# Patient Record
Sex: Male | Born: 1980 | Race: Black or African American | Hispanic: No | Marital: Single | Smoking: Light tobacco smoker
Health system: Southern US, Community
[De-identification: ages and names within clinical notes are randomized; demographics above are authoritative.]

## PROBLEM LIST (undated history)

## (undated) DIAGNOSIS — Z789 Other specified health status: Secondary | ICD-10-CM

---

## 2004-10-01 ENCOUNTER — Emergency Department: Payer: Self-pay | Admitting: Emergency Medicine

## 2005-01-04 ENCOUNTER — Emergency Department: Payer: Self-pay | Admitting: Emergency Medicine

## 2008-01-05 ENCOUNTER — Emergency Department: Payer: Self-pay | Admitting: Emergency Medicine

## 2008-11-09 ENCOUNTER — Emergency Department: Payer: Self-pay | Admitting: Emergency Medicine

## 2009-03-04 ENCOUNTER — Emergency Department: Payer: Self-pay | Admitting: Emergency Medicine

## 2011-09-13 ENCOUNTER — Emergency Department: Payer: Self-pay | Admitting: Emergency Medicine

## 2012-01-16 ENCOUNTER — Emergency Department: Payer: Self-pay | Admitting: Emergency Medicine

## 2013-10-22 ENCOUNTER — Emergency Department: Payer: Self-pay | Admitting: Emergency Medicine
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
## 2015-06-02 IMAGING — CR DG SHOULDER 3+V*L*
1 series · 3 of 3 positions shown · non-contrast
Comparison: None.

CLINICAL DATA: Left shoulder pain, numbness

EXAM:
DG SHOULDER 3+VIEWS LEFT

[Series 1: w shoulder grashey left · 0.14mm/px · 3 of 3 slices shown]
[im 1/3]
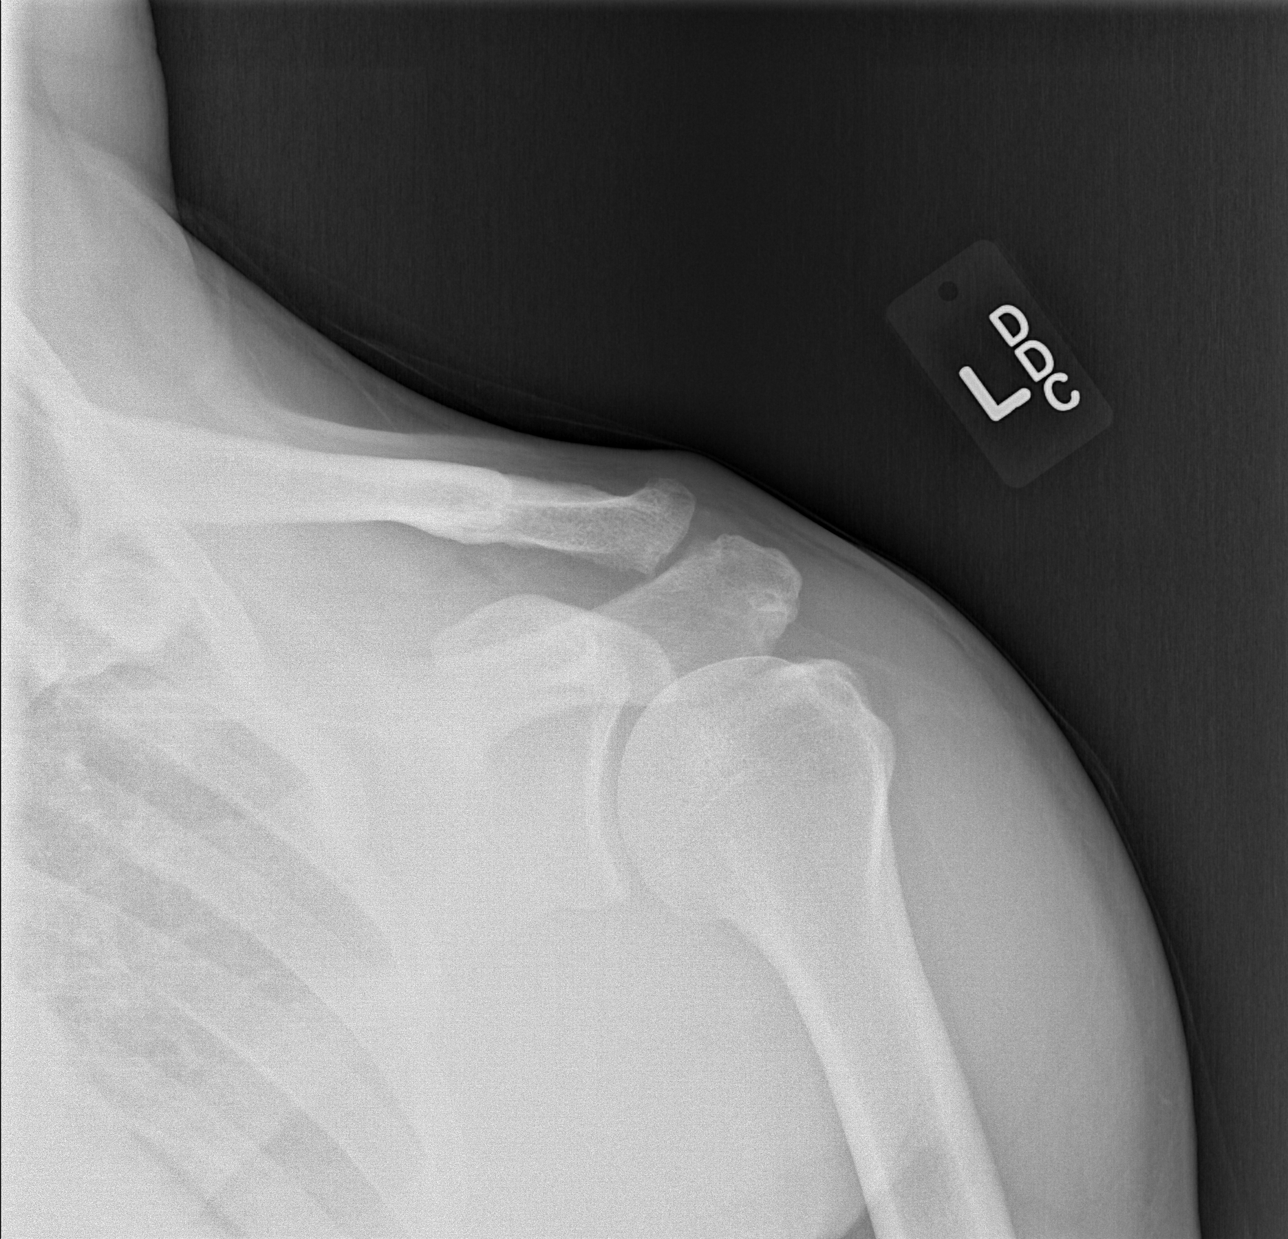
[im 2/3]
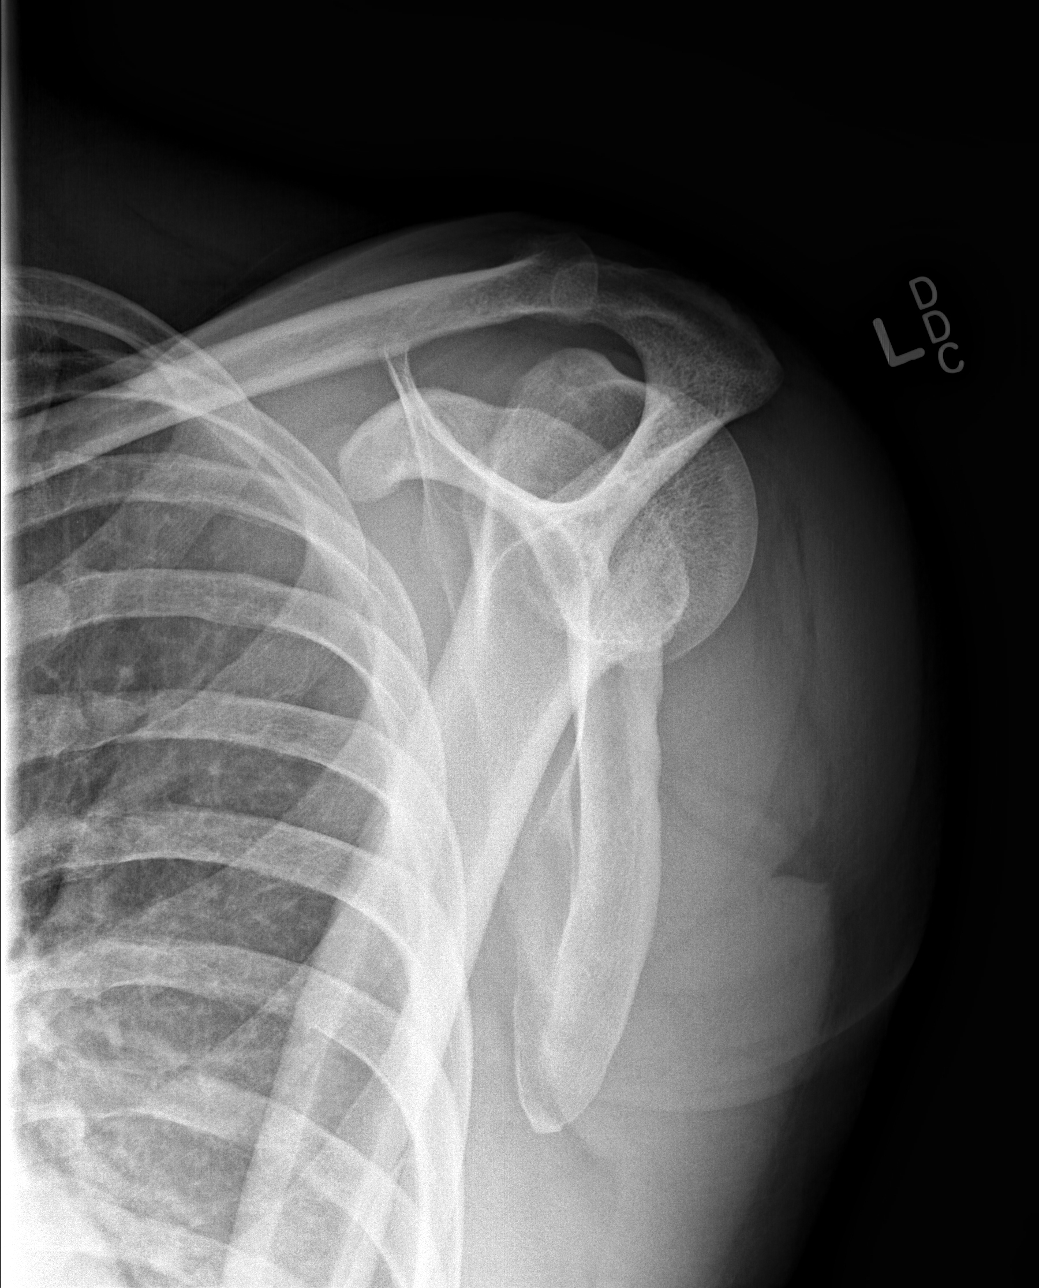
[im 3/3]
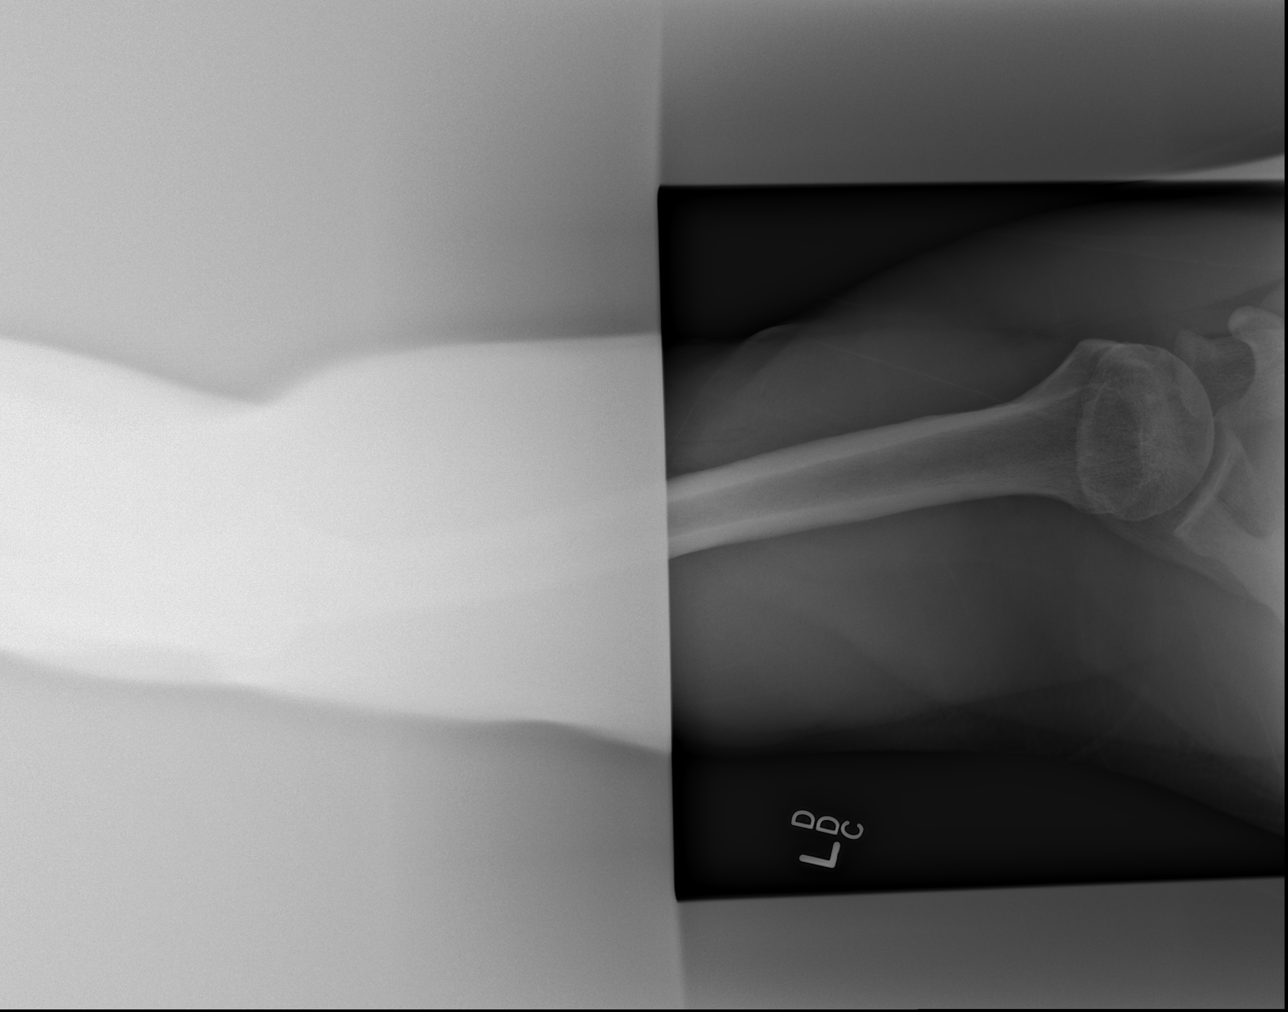

[3 of 3 positions shown; findings below may reference images not displayed]

FINDINGS: Three views of left shoulder submitted. No acute fracture or
subluxation. No radiopaque foreign body.
IMPRESSION: Negative.

## 2016-01-05 DIAGNOSIS — S0181XA Laceration without foreign body of other part of head, initial encounter: Secondary | ICD-10-CM | POA: Insufficient documentation

## 2016-01-05 DIAGNOSIS — Y929 Unspecified place or not applicable: Secondary | ICD-10-CM | POA: Insufficient documentation

## 2016-01-05 DIAGNOSIS — W268XXA Contact with other sharp object(s), not elsewhere classified, initial encounter: Secondary | ICD-10-CM | POA: Insufficient documentation

## 2016-01-05 DIAGNOSIS — Y939 Activity, unspecified: Secondary | ICD-10-CM | POA: Insufficient documentation

## 2016-01-05 DIAGNOSIS — Y999 Unspecified external cause status: Secondary | ICD-10-CM | POA: Insufficient documentation
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
## 2016-01-06 ENCOUNTER — Emergency Department
Admission: EM | Admit: 2016-01-06 | Discharge: 2016-01-06 | Disposition: A | Discharge status: Home / Self Care | Payer: Self-pay | Attending: Emergency Medicine | Admitting: Emergency Medicine

## 2016-01-06 DIAGNOSIS — S0181XA Laceration without foreign body of other part of head, initial encounter: Secondary | ICD-10-CM
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
## 2016-01-06 NOTE — ED Notes (Signed)
Discharge instructions reviewed with patient. Patient verbalized understanding. Patient ambulated to lobby without difficulty.   

## 2016-01-06 NOTE — Discharge Instructions (Signed)
Facial Laceration ° A facial laceration is a cut on the face. These injuries can be painful and cause bleeding. Lacerations usually heal quickly, but they need special care to reduce scarring. °DIAGNOSIS  °Your health care provider will take a medical history, ask for details about how the injury occurred, and examine the wound to determine how deep the cut is. °TREATMENT  °Some facial lacerations may not require closure. Others may not be able to be closed because of an increased risk of infection. The risk of infection and the chance for successful closure will depend on various factors, including the amount of time since the injury occurred. °The wound may be cleaned to help prevent infection. If closure is appropriate, pain medicines may be given if needed. Your health care provider will use stitches (sutures), wound glue (adhesive), or skin adhesive strips to repair the laceration. These tools bring the skin edges together to allow for faster healing and a better cosmetic outcome. If needed, you may also be given a tetanus shot. °HOME CARE INSTRUCTIONS °· Only take over-the-counter or prescription medicines as directed by your health care provider. °· Follow your health care provider's instructions for wound care. These instructions will vary depending on the technique used for closing the wound. °For Sutures: °· Keep the wound clean and dry.   °· If you were given a bandage (dressing), you should change it at least once a day. Also change the dressing if it becomes wet or dirty, or as directed by your health care provider.   °· Wash the wound with soap and water 2 times a day. Rinse the wound off with water to remove all soap. Pat the wound dry with a clean towel.   °· After cleaning, apply a thin layer of the antibiotic ointment recommended by your health care provider. This will help prevent infection and keep the dressing from sticking.   °· You may shower as usual after the first 24 hours. Do not soak the  wound in water until the sutures are removed.   °· Get your sutures removed as directed by your health care provider. With facial lacerations, sutures should usually be taken out after 4-5 days to avoid stitch marks.   °· Wait a few days after your sutures are removed before applying any makeup. °For Skin Adhesive Strips: °· Keep the wound clean and dry.   °· Do not get the skin adhesive strips wet. You may bathe carefully, using caution to keep the wound dry.   °· If the wound gets wet, pat it dry with a clean towel.   °· Skin adhesive strips will fall off on their own. You may trim the strips as the wound heals. Do not remove skin adhesive strips that are still stuck to the wound. They will fall off in time.   °For Wound Adhesive: °· You may briefly wet your wound in the shower or bath. Do not soak or scrub the wound. Do not swim. Avoid periods of heavy sweating until the skin adhesive has fallen off on its own. After showering or bathing, gently pat the wound dry with a clean towel.   °· Do not apply liquid medicine, cream medicine, ointment medicine, or makeup to your wound while the skin adhesive is in place. This may loosen the film before your wound is healed.   °· If a dressing is placed over the wound, be careful not to apply tape directly over the skin adhesive. This may cause the adhesive to be pulled off before the wound is healed.   °· Avoid   prolonged exposure to sunlight or tanning lamps while the skin adhesive is in place. °· The skin adhesive will usually remain in place for 5-10 days, then naturally fall off the skin. Do not pick at the adhesive film.   °After Healing: °Once the wound has healed, cover the wound with sunscreen during the day for 1 full year. This can help minimize scarring. Exposure to ultraviolet light in the first year will darken the scar. It can take 1-2 years for the scar to lose its redness and to heal completely.  °SEEK MEDICAL CARE IF: °· You have a fever. °SEEK IMMEDIATE  MEDICAL CARE IF: °· You have redness, pain, or swelling around the wound.   °· You see a yellowish-white fluid (pus) coming from the wound.   °  °This information is not intended to replace advice given to you by your health care provider. Make sure you discuss any questions you have with your health care provider. °  °Document Released: 08/05/2004 Document Revised: 07/19/2014 Document Reviewed: 02/08/2013 °Elsevier Interactive Patient Education ©2016 [employer] ° °Stitches, Staples, or Adhesive Wound Closure °Health care providers use stitches (sutures), staples, and certain glue (skin adhesives) to hold skin together while it heals (wound closure). You may need this treatment after you have surgery or if you cut your skin accidentally. These methods help your skin to heal more quickly and make it less likely that you will have a scar. A wound may take several months to heal completely. °The type of wound you have determines when your wound gets closed. In most cases, the wound is closed as soon as possible (primary skin closure). Sometimes, closure is delayed so the wound can be cleaned and allowed to heal naturally. This reduces the chance of infection. Delayed closure may be needed if your wound: °· Is caused by a bite. °· Happened more than 6 hours ago. °· Involves loss of skin or the tissues under the skin. °· Has dirt or debris in it that cannot be removed. °· Is infected. °WHAT ARE THE DIFFERENT KINDS OF WOUND CLOSURES? °There are many options for wound closure. The one that your health care provider uses depends on how deep and how large your wound is. °Adhesive Glue °To use this type of glue to close a wound, your health care provider holds the edges of the wound together and paints the glue on the surface of your skin. You may need more than one layer of glue. Then the wound may be covered with a light bandage (dressing). °This type of skin closure may be used for small wounds that are not deep  (superficial). Using glue for wound closure is less painful than other methods. It does not require a medicine that numbs the area (local anesthetic). This method also leaves nothing to be removed. Adhesive glue is often used for children and on facial wounds. °Adhesive glue cannot be used for wounds that are deep, uneven, or bleeding. It is not used inside of a wound.  °Adhesive Strips °These strips are made of sticky (adhesive), porous paper. They are applied across your skin edges like a regular adhesive bandage. You leave them on until they fall off. °Adhesive strips may be used to close very superficial wounds. They may also be used along with sutures to improve the closure of your skin edges.  °Sutures °Sutures are the oldest method of wound closure. Sutures can be made from natural substances, such as silk, or from synthetic materials, such as nylon and steel. They can   be made from a material that your body can break down as your wound heals (absorbable), or they can be made from a material that needs to be removed from your skin (nonabsorbable). They come in many different strengths and sizes. °Your health care provider attaches the sutures to a steel needle on one end. Sutures can be passed through your skin, or through the tissues beneath your skin. Then they are tied and cut. Your skin edges may be closed in one continuous stitch or in separate stitches. °Sutures are strong and can be used for all kinds of wounds. Absorbable sutures may be used to close tissues under the skin. The disadvantage of sutures is that they may cause skin reactions that lead to infection. Nonabsorbable sutures need to be removed. °Staples °When surgical staples are used to close a wound, the edges of your skin on both sides of the wound are brought close together. A staple is placed across the wound, and an instrument secures the edges together. Staples are often used to close surgical cuts (incisions). °Staples are faster to  use than sutures, and they cause less skin reaction. Staples need to be removed using a tool that bends the staples away from your skin. °HOW DO I CARE FOR MY WOUND CLOSURE? °· Take medicines only as directed by your health care provider. °· If you were prescribed an antibiotic medicine for your wound, finish it all even if you start to feel better. °· Use ointments or creams only as directed by your health care provider. °· Wash your hands with soap and water before and after touching your wound. °· Do not soak your wound in water. Do not take baths, swim, or use a hot tub until your health care provider approves. °· Ask your health care provider when you can start showering. Cover your wound if directed by your health care provider. °· Do not take out your own sutures or staples. °· Do not pick at your wound. Picking can cause an infection. °· Keep all follow-up visits as directed by your health care provider. This is important. °HOW LONG WILL I HAVE MY WOUND CLOSURE? °· Leave adhesive glue on your skin until the glue peels away. °· Leave adhesive strips on your skin until the strips fall off. °· Absorbable sutures will dissolve within several days. °· Nonabsorbable sutures and staples must be removed. The location of the wound will determine how long they stay in. This can range from several days to a couple of weeks. °WHEN SHOULD I SEEK HELP FOR MY WOUND CLOSURE? °Contact your health care provider if: °· You have a fever. °· You have chills. °· You have drainage, redness, swelling, or pain at your wound. °· There is a bad smell coming from your wound. °· The skin edges of your wound start to separate after your sutures have been removed. °· Your wound becomes thick, raised, and darker in color after your sutures come out (scarring). °  °This information is not intended to replace advice given to you by your health care provider. Make sure you discuss any questions you have with your health care provider. °    °Document Released: 03/23/2001 Document Revised: 07/19/2014 Document Reviewed: 12/05/2013 °Elsevier Interactive Patient Education ©2016 Elsevier Inc. ° °

## 2019-10-16 ENCOUNTER — Ambulatory Visit: Payer: Self-pay

## 2019-10-16 ENCOUNTER — Ambulatory Visit: Payer: Commercial Managed Care - PPO | Admitting: Family Medicine

## 2019-10-16 ENCOUNTER — Other Ambulatory Visit: Payer: Self-pay

## 2019-10-16 DIAGNOSIS — Z113 Encounter for screening for infections with a predominantly sexual mode of transmission: Secondary | ICD-10-CM

## 2019-10-16 LAB — GRAM STAIN

## 2019-10-16 NOTE — Progress Notes (Signed)
Here today for STD screening. Accepts bloodwork. Zissy Hamlett, RN ° °

## 2019-10-16 NOTE — Progress Notes (Signed)
Gram Stain results reviewed by provider Maximiano Coss, PA-C. Per same no treatment indicated. Tawny Hopping, RN

## 2019-10-16 NOTE — Progress Notes (Signed)
  [facility] Department STI clinic/screening visit  Subjective:  Gavin Diaz is a 39 y.o. male being seen today for  Chief Complaint  Patient presents with  . SEXUALLY TRANSMITTED DISEASE     The patient reports they do have symptoms.   Patient has the following medical conditions:  There are no problems to display for this patient.   HPI  Pt reports he would like STI screening. He has seen clear discharge from penis once or twice over past x1-2 months. Urinated 3 hrs ago. No other symptoms.  See flowsheet for further details and programmatic requirements.    No components found for: HCV  The following portions of the patient's history were reviewed and updated as appropriate: allergies, current medications, past medical history, past social history, past surgical history and problem list.  Objective:  There were no vitals filed for this visit.   Physical Exam Constitutional:      Appearance: Normal appearance.  HENT:     Head: Normocephalic and atraumatic.     Comments: No nits or hair loss    Mouth/Throat:     Mouth: Mucous membranes are moist.     Pharynx: Oropharynx is clear. No oropharyngeal exudate or posterior oropharyngeal erythema.  Pulmonary:     Effort: Pulmonary effort is normal.  Abdominal:     General: Abdomen is flat.     Palpations: Abdomen is soft. There is no hepatomegaly or mass.     Tenderness: There is no abdominal tenderness.  Genitourinary:    Pubic Area: No rash or pubic lice.      Penis: Normal and circumcised.      Testes: Normal.     Epididymis:     Right: Normal.     Left: Normal.     Rectum: Normal.  Lymphadenopathy:     Head:     Right side of head: No preauricular or posterior auricular adenopathy.     Left side of head: No preauricular or posterior auricular adenopathy.     Cervical: No cervical adenopathy.     Upper Body:     Right upper body: No supraclavicular or axillary adenopathy.     Left upper body: No  supraclavicular or axillary adenopathy.     Lower Body: No right inguinal adenopathy. No left inguinal adenopathy.  Skin:    General: Skin is warm and dry.     Findings: No rash.  Neurological:     Mental Status: He is alert and oriented to person, place, and time.       Assessment and Plan:  Gavin Diaz is a 39 y.o. male presenting to the [facility] Department for STI screening   1. Screening examination for venereal disease -Screenings today as below. Treat gram stain per standing order. -Patient does meet criteria for HepB, HepC Screening. Accepts these screenings. -Advised to RTC for further evaluation if symptoms persist or worsen. Counseled on warning s/sx and when to seek care. Recommended condom use with all sex and discussed importance of condom use for STI prevention. - Gram stain - HBV Antigen/Antibody [facility] - HIV/HCV [place] [facility] - Syphilis Serology, [place] [facility]     Return if symptoms worsen or fail to improve.  Future Appointments  Date Time Provider Department Center  10/16/2019  2:20 PM Carah Barrientes, Hulda Humphrey, PA-C [facility] None    Ann Held, PA-C

## 2019-10-19 LAB — HEPATITIS B SURFACE ANTIGEN

## 2019-10-20 LAB — GONOCOCCUS CULTURE

## 2019-10-23 LAB — HM HIV SCREENING LAB: HM HIV Screening: NEGATIVE

## 2019-10-23 LAB — HM HEPATITIS C SCREENING LAB: HM Hepatitis Screen: NEGATIVE

## 2021-12-07 ENCOUNTER — Other Ambulatory Visit: Payer: Self-pay

## 2021-12-07 ENCOUNTER — Emergency Department
Admission: EM | Admit: 2021-12-07 | Discharge: 2021-12-07 | Disposition: A | Discharge status: Home / Self Care | Payer: Commercial Managed Care - PPO | Attending: Emergency Medicine | Admitting: Emergency Medicine

## 2021-12-07 ENCOUNTER — Encounter: Payer: Self-pay | Admitting: Emergency Medicine

## 2021-12-07 DIAGNOSIS — W57XXXA Bitten or stung by nonvenomous insect and other nonvenomous arthropods, initial encounter: Secondary | ICD-10-CM | POA: Insufficient documentation

## 2021-12-07 DIAGNOSIS — S60861A Insect bite (nonvenomous) of right wrist, initial encounter: Secondary | ICD-10-CM | POA: Insufficient documentation

## 2021-12-07 MED ORDER — CEPHALEXIN 500 MG PO CAPS: 500.0000 mg | ORAL_CAPSULE | Freq: Two times a day (BID) | ORAL | 0 refills | Status: DC

## 2021-12-07 NOTE — ED Triage Notes (Signed)
Pt reports thinks he got bit by a spider and today he woke up and had swelling and pain in his right wrist.

## 2021-12-07 NOTE — ED Provider Notes (Signed)
   [facility] Provider Note    Event Date/Time   First MD Initiated Contact with Patient 12/07/21 1612     (approximate)   History   Insect Bite   HPI  Gavin Diaz is a 41 y.o. male who presents with concern for insect bite to his right wrist, he noticed it this morning, reports it was mildly swollen, denies pain.  Reports clearish discharge.  No fever or redness     Physical Exam   Triage Vital Signs: ED Triage Vitals  Enc Vitals Group     BP 12/07/21 1448 (!) 142/84     Pulse Rate 12/07/21 1448 72     Resp 12/07/21 1448 16     Temp 12/07/21 1448 98 F (36.7 C)     Temp Source 12/07/21 1448 Oral     SpO2 12/07/21 1448 97 %     Weight 12/07/21 1442 70.3 kg (155 lb)     Height 12/07/21 1442 1.727 m (5\' 8" )     Head Circumference --      Peak Flow --      Pain Score 12/07/21 1442 1     Pain Loc --      Pain Edu? --      Excl. in GC? --     Most recent vital signs: Vitals:   12/07/21 1448  BP: (!) 142/84  Pulse: 72  Resp: 16  Temp: 98 F (36.7 C)  SpO2: 97%     General: Awake, no distress.  CV:  Good peripheral perfusion.  Resp:  Normal effort.  Abd:  No distention.  Other:  Small area of inflammation right dorsal lateral wrist, no purulent discharge, small mount of clearish discharge, nontender, no erythema   ED Results / Procedures / Treatments   Labs (all labs ordered are listed, but only abnormal results are displayed) Labs Reviewed - No data to display   EKG     RADIOLOGY     PROCEDURES:  Critical Care performed:   Procedures   MEDICATIONS ORDERED IN ED: Medications - No data to display   IMPRESSION / MDM / ASSESSMENT AND PLAN / ED COURSE  I reviewed the triage vital signs and the nursing notes. Patient's presentation is most consistent with acute, uncomplicated illness.  Differential includes insect bite, cellulitis, abscess  Patient's exam is most consistent with insect bite, no swelling or  erythema to suggest infection  Recommend conservative care, prescription provided in case worsening symptoms        FINAL CLINICAL IMPRESSION(S) / ED DIAGNOSES   Final diagnoses:  Insect bite of right forearm, initial encounter     Rx / DC Orders   ED Discharge Orders          Ordered    cephALEXin (KEFLEX) 500 MG capsule  2 times daily        12/07/21 1618             Note:  This document was prepared using Dragon voice recognition software and may include unintentional dictation errors.   12/09/21, MD 12/07/21 [phone]

## 2021-12-07 NOTE — ED Triage Notes (Signed)
Pt called from [facility] to treatment room, no response 

## 2022-06-11 DIAGNOSIS — X58XXXA Exposure to other specified factors, initial encounter: Secondary | ICD-10-CM | POA: Insufficient documentation

## 2022-06-11 DIAGNOSIS — S6992XA Unspecified injury of left wrist, hand and finger(s), initial encounter: Secondary | ICD-10-CM | POA: Diagnosis present

## 2022-06-11 DIAGNOSIS — S66912A Strain of unspecified muscle, fascia and tendon at wrist and hand level, left hand, initial encounter: Secondary | ICD-10-CM | POA: Diagnosis not present

## 2022-06-12 ENCOUNTER — Emergency Department
Admission: EM | Admit: 2022-06-12 | Discharge: 2022-06-12 | Disposition: A | Discharge status: Home / Self Care | Payer: BLUE CROSS/BLUE SHIELD | Attending: Emergency Medicine | Admitting: Emergency Medicine

## 2022-06-12 ENCOUNTER — Emergency Department: Payer: BLUE CROSS/BLUE SHIELD

## 2022-06-12 ENCOUNTER — Other Ambulatory Visit: Payer: Self-pay

## 2022-06-12 DIAGNOSIS — S66912A Strain of unspecified muscle, fascia and tendon at wrist and hand level, left hand, initial encounter: Secondary | ICD-10-CM

## 2022-06-12 NOTE — ED Notes (Signed)
Patient discharged at this time. Ambulated to lobby with independent and steady gait. Breathing unlabored speaking in full sentences. Verbalized understanding of all discharge, follow up, and medication teaching. Discharged homed with all belongings.   

## 2022-06-12 NOTE — ED Provider Notes (Signed)
[facility] Provider Note    Event Date/Time   First MD Initiated Contact with Patient 06/12/22 [phone]     (approximate)   History   Wrist Pain   HPI  Gavin Diaz is a 41 y.o. male who presents for evaluation of left wrist pain.  It has been present for about 3 days.  He thinks it is little bit swollen.  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  No specific injury of which he is aware.  It feels worse when he bends his wrist backwards.  No numbness or tingling.  He has right arm dominant.     Physical Exam   Triage Vital Signs: ED Triage Vitals  Enc Vitals Group     BP 06/12/22 0015 125/76     Pulse Rate 06/12/22 0015 75     Resp 06/12/22 0015 18     Temp 06/12/22 0015 98.2 F (36.8 C)     Temp Source 06/12/22 0015 Oral     SpO2 06/12/22 0015 96 %     Weight 06/12/22 0014 77.1 kg (170 lb)     Height 06/12/22 0014 1.753 m (5\' 9" )     Head Circumference --      Peak Flow --      Pain Score 06/12/22 0014 7     Pain Loc --      Pain Edu? --      Excl. in GC? --     Most recent vital signs: Vitals:   06/12/22 0015  BP: 125/76  Pulse: 75  Resp: 18  Temp: 98.2 F (36.8 C)  SpO2: 96%     General: Awake, no distress.  CV:  Good peripheral perfusion.  Resp:  Normal effort.  Abd:  No distention.  MSK:  Normal-appearing left wrist.  I do not appreciate any specific swelling and the compartments are soft and easily compressible in his forearm and hand.  He has reproducible pain/tenderness when I palpate his wrist and the proximal part of his hand and he reports that it hurts when he flexes and extends his wrist.  He has an easily palpable radial pulse.  Although range of motion, both active and passive, seem to hurt on exam, he is able to gesticulate and use his hands and wrist when he is talking, mimicking how he uses his hand and wrist for work, 14/02/23.   ED Results / Procedures / Treatments   Labs (all labs ordered are listed, but  only abnormal results are displayed) Labs Reviewed - No data to display    RADIOLOGY I viewed and interpreted the patient's hand and wrist x-rays.  I see no evidence of fracture nor dislocation.  I also read the radiologist's report, which confirmed no acute findings.    PROCEDURES:  Critical Care performed: No  Procedures   MEDICATIONS ORDERED IN ED: Medications - No data to display   IMPRESSION / MDM / ASSESSMENT AND PLAN / ED COURSE  I reviewed the triage vital signs and the nursing notes.                              Differential diagnosis includes, but is not limited to, musculoskeletal strain, ligamentous injury, fracture/dislocation, nerve impingement, carpal tunnel syndrome, etc.  Patient's presentation is most consistent with acute complicated illness / injury requiring diagnostic workup.   Patient appears comfortable and vital signs are stable and  within normal limits.  As documented above, his x-rays are normal.  Physical exam is generally reassuring.  I believe he does have some pain and discomfort consistent with a soft tissue strain, and possibly carpal tunnel syndrome, but he is able to use his hand and his arm.  I witnessed him texting on his phone using both hands and is able to use his hands and arms to gesture when talking.  I provided a Velcro wrist splint and recommended elevation, ice, ibuprofen 600 mg 3 times daily with meals, and orthopedics follow-up.  He said he did not need a work note.  I gave my usual return precautions.       FINAL CLINICAL IMPRESSION(S) / ED DIAGNOSES   Final diagnoses:  Strain of left wrist, initial encounter     Rx / DC Orders   ED Discharge Orders     None        Note:  This document was prepared using Dragon voice recognition software and may include unintentional dictation errors.   Loleta Rose, MD 06/12/22 [phone]

## 2022-06-12 NOTE — ED Triage Notes (Signed)
L wrist and hand pain x 3 days. Denies known injury or trauma. Swelling noted. No loss of ROM or sensation. Pt alert and oriented breathing unlabored speaking in full sentences.

## 2022-06-12 NOTE — Discharge Instructions (Addendum)
Try using the provided Velcro wrist splint for comfort.  Take ibuprofen 600 mg 3 times a day with meals for at least 5 to 7 days to see if that helps.  Keep your wrist and arm elevated when possible, such as on a pillow if you are watching TV, and we recommend that you call the office of Dr. Mathis Bud to schedule a follow-up appointment with her or one of her colleagues in the orthopedics clinic.

## 2022-06-12 NOTE — ED Notes (Signed)
Call no answer x1

## 2023-03-30 ENCOUNTER — Emergency Department
Admission: EM | Admit: 2023-03-30 | Discharge: 2023-03-30 | Disposition: A | Discharge status: Home / Self Care | Payer: BLUE CROSS/BLUE SHIELD | Attending: Emergency Medicine | Admitting: Emergency Medicine

## 2023-03-30 ENCOUNTER — Emergency Department: Payer: No Typology Code available for payment source

## 2023-03-30 ENCOUNTER — Other Ambulatory Visit: Payer: Self-pay

## 2023-03-30 DIAGNOSIS — Y92410 Unspecified street and highway as the place of occurrence of the external cause: Secondary | ICD-10-CM | POA: Diagnosis not present

## 2023-03-30 DIAGNOSIS — S8001XA Contusion of right knee, initial encounter: Secondary | ICD-10-CM | POA: Diagnosis not present

## 2023-03-30 DIAGNOSIS — S0990XA Unspecified injury of head, initial encounter: Secondary | ICD-10-CM | POA: Diagnosis not present

## 2023-03-30 DIAGNOSIS — S8991XA Unspecified injury of right lower leg, initial encounter: Secondary | ICD-10-CM | POA: Diagnosis present

## 2023-03-30 MED ORDER — IBUPROFEN 600 MG PO TABS
600.0000 mg | ORAL_TABLET | Freq: Once | ORAL | Status: AC
  Administered 2023-03-30: 600 mg via ORAL
  Filled 2023-03-30: qty 1

## 2023-03-30 MED ORDER — TRAMADOL HCL 50 MG PO TABS
50.0000 mg | ORAL_TABLET | Freq: Once | ORAL | Status: AC
  Administered 2023-03-30: 50 mg via ORAL
  Filled 2023-03-30: qty 1

## 2023-03-30 MED ORDER — TRAMADOL HCL 50 MG PO TABS: 50.0000 mg | ORAL_TABLET | Freq: Four times a day (QID) | ORAL | 0 refills | Status: DC | PRN

## 2023-03-30 MED ORDER — IBUPROFEN 600 MG PO TABS: 600.0000 mg | ORAL_TABLET | Freq: Four times a day (QID) | ORAL | 0 refills | Status: AC | PRN

## 2023-03-30 NOTE — ED Notes (Signed)
ED Provider at bedside. 
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
## 2023-04-04 ENCOUNTER — Other Ambulatory Visit: Payer: Self-pay

## 2023-04-04 ENCOUNTER — Emergency Department
Admission: EM | Admit: 2023-04-04 | Discharge: 2023-04-04 | Disposition: A | Discharge status: Home / Self Care | Payer: BLUE CROSS/BLUE SHIELD | Attending: Emergency Medicine | Admitting: Emergency Medicine

## 2023-04-04 ENCOUNTER — Emergency Department: Payer: BLUE CROSS/BLUE SHIELD

## 2023-04-04 DIAGNOSIS — M5441 Lumbago with sciatica, right side: Secondary | ICD-10-CM | POA: Insufficient documentation

## 2023-04-04 DIAGNOSIS — Y92410 Unspecified street and highway as the place of occurrence of the external cause: Secondary | ICD-10-CM | POA: Insufficient documentation

## 2023-04-04 DIAGNOSIS — M545 Low back pain, unspecified: Secondary | ICD-10-CM | POA: Diagnosis present

## 2023-04-04 MED ORDER — KETOROLAC TROMETHAMINE 15 MG/ML IJ SOLN
15.0000 mg | Freq: Once | INTRAMUSCULAR | Status: AC
  Administered 2023-04-04: 15 mg via INTRAMUSCULAR
  Filled 2023-04-04: qty 1

## 2023-04-04 MED ORDER — LIDOCAINE 5 % EX PTCH
1.0000 | MEDICATED_PATCH | CUTANEOUS | Status: DC
  Administered 2023-04-04: 1 via TRANSDERMAL
  Filled 2023-04-04: qty 1
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
## 2023-04-04 NOTE — Discharge Instructions (Signed)
Your CT scan is normal. Please return to the emergency department for any new, worsening, or changing symptoms or other concerns including weakness in your legs, urinary or stool incontinence or retention, numbness or tingling in your extremities/buttocks/groin, fevers, or any other concerns or change in symptoms.  It was a pleasure caring for you today.

## 2023-09-09 ENCOUNTER — Emergency Department
Admission: EM | Admit: 2023-09-09 | Discharge: 2023-09-09 | Disposition: A | Discharge status: Home / Self Care | Payer: BLUE CROSS/BLUE SHIELD | Attending: Emergency Medicine | Admitting: Emergency Medicine

## 2023-09-09 ENCOUNTER — Other Ambulatory Visit: Payer: Self-pay

## 2023-09-09 ENCOUNTER — Encounter: Payer: Self-pay | Admitting: Emergency Medicine

## 2023-09-09 ENCOUNTER — Emergency Department: Payer: BLUE CROSS/BLUE SHIELD

## 2023-09-09 DIAGNOSIS — M79645 Pain in left finger(s): Secondary | ICD-10-CM | POA: Diagnosis not present

## 2023-09-09 DIAGNOSIS — S6710XA Crushing injury of unspecified finger(s), initial encounter: Secondary | ICD-10-CM

## 2023-09-09 DIAGNOSIS — S61211A Laceration without foreign body of left index finger without damage to nail, initial encounter: Secondary | ICD-10-CM | POA: Diagnosis not present

## 2023-09-09 DIAGNOSIS — W230XXA Caught, crushed, jammed, or pinched between moving objects, initial encounter: Secondary | ICD-10-CM | POA: Insufficient documentation

## 2023-09-09 DIAGNOSIS — S67191A Crushing injury of left index finger, initial encounter: Secondary | ICD-10-CM | POA: Diagnosis present

## 2023-09-09 NOTE — ED Triage Notes (Signed)
 Shut left index finger in trunk this morning. C/O pain and laceration

## 2023-09-09 NOTE — Discharge Instructions (Signed)
 Ice 20 minutes per hour off and on throughout the day.  Wear the splint when at work.  Apply antibiotic ointment and dressing to the finger daily.

## 2023-09-10 ENCOUNTER — Emergency Department

## 2023-09-10 ENCOUNTER — Other Ambulatory Visit: Payer: Self-pay

## 2023-09-10 DIAGNOSIS — S6991XA Unspecified injury of right wrist, hand and finger(s), initial encounter: Secondary | ICD-10-CM | POA: Diagnosis present

## 2023-09-10 DIAGNOSIS — S62336A Displaced fracture of neck of fifth metacarpal bone, right hand, initial encounter for closed fracture: Secondary | ICD-10-CM | POA: Insufficient documentation

## 2023-09-10 DIAGNOSIS — S62334A Displaced fracture of neck of fourth metacarpal bone, right hand, initial encounter for closed fracture: Secondary | ICD-10-CM | POA: Diagnosis not present

## 2023-09-10 DIAGNOSIS — S01511A Laceration without foreign body of lip, initial encounter: Secondary | ICD-10-CM | POA: Insufficient documentation
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
## 2023-09-10 NOTE — ED Provider Notes (Signed)
 [facility] Provider Note    Event Date/Time   First MD Initiated Contact with Patient 09/09/23 [phone]     (approximate)   History   Finger Injury   HPI  Gavin Diaz is a 43 y.o. male  with no significant past medical history and as listed in EMR presents to the emergency department for treatment and evaluation of crush injury to left index finger. He shut it in the truck door this morning and now has a laceration. Bleeding well controlled. Tdap is current.      Physical Exam   Triage Vital Signs: ED Triage Vitals  Encounter Vitals Group     BP 09/09/23 0758 128/83     Systolic BP Percentile --      Diastolic BP Percentile --      Pulse Rate 09/09/23 0758 92     Resp 09/09/23 0758 16     Temp 09/09/23 0758 98.2 F (36.8 C)     Temp Source 09/09/23 0758 Oral     SpO2 09/09/23 0758 100 %     Weight 09/09/23 0757 169 lb 12.1 oz (77 kg)     Height 09/09/23 1035 5\' 9"  (1.753 m)     Head Circumference --      Peak Flow --      Pain Score 09/09/23 0757 0     Pain Loc --      Pain Education --      Exclude from Growth Chart --     Most recent vital signs: Vitals:   09/09/23 0758  BP: 128/83  Pulse: 92  Resp: 16  Temp: 98.2 F (36.8 C)  SpO2: 100%    General: Awake, no distress.  CV:  Good peripheral perfusion.  Resp:  Normal effort.  Abd:  No distention.  Other:  Closed laceration to the lateral PIP left index finger   ED Results / Procedures / Treatments   Labs (all labs ordered are listed, but only abnormal results are displayed) Labs Reviewed - No data to display   EKG  Not indicated.   RADIOLOGY  Image and radiology report reviewed and interpreted by me. Radiology report consistent with the same.  No bony abnormality left index finger.  PROCEDURES:  Critical Care performed: No  Procedures  Static finger splint applied to left index finger.  MEDICATIONS ORDERED IN ED:  Medications - No data to  display   IMPRESSION / MDM / ASSESSMENT AND PLAN / ED COURSE   I have reviewed the triage note.  Differential diagnosis includes, but is not limited to, finger fracture, crush injury, laceration  Patient's presentation is most consistent with acute illness / injury with system symptoms.  43 year old male presenting to the emergency department for evaluation after accidentally shutting his left index finger in the truck door this morning.  See HPI for further details.  On exam, he does have a superficial laceration to the lateral aspect at the lateral PIP of the left index finger.  No active bleeding.  X-ray is without evidence of fracture or bony abnormality.  Wound was cleaned and sterile bandage applied under static finger splint.  Home care wound care discussed with the patient.  ER return precautions advised.      FINAL CLINICAL IMPRESSION(S) / ED DIAGNOSES   Final diagnoses:  Laceration of left index finger without foreign body without damage to nail, initial encounter  Crushing injury of finger, initial encounter     Rx / DC  Orders   ED Discharge Orders     None        Note:  This document was prepared using Dragon voice recognition software and may include unintentional dictation errors.   Chinita Pester, FNP 09/10/23 1015    Chesley Noon, MD 09/10/23 2320

## 2023-09-11 ENCOUNTER — Emergency Department
Admission: EM | Admit: 2023-09-11 | Discharge: 2023-09-11 | Disposition: A | Discharge status: Home / Self Care | Attending: Emergency Medicine | Admitting: Emergency Medicine

## 2023-09-11 DIAGNOSIS — S01511A Laceration without foreign body of lip, initial encounter: Secondary | ICD-10-CM

## 2023-09-11 DIAGNOSIS — S62339A Displaced fracture of neck of unspecified metacarpal bone, initial encounter for closed fracture: Secondary | ICD-10-CM

## 2023-09-11 MED ORDER — AMOXICILLIN-POT CLAVULANATE 875-125 MG PO TABS: 1.0000 | ORAL_TABLET | Freq: Two times a day (BID) | ORAL | 0 refills | Status: AC

## 2023-09-11 MED ORDER — LIDOCAINE HCL (PF) 1 % IJ SOLN
5.0000 mL | Freq: Once | INTRAMUSCULAR | Status: AC
  Administered 2023-09-11: 5 mL
  Filled 2023-09-11: qty 5

## 2023-09-11 MED ORDER — OXYCODONE-ACETAMINOPHEN 5-325 MG PO TABS
1.0000 | ORAL_TABLET | Freq: Once | ORAL | Status: AC
  Administered 2023-09-11: 1 via ORAL
  Filled 2023-09-11: qty 1

## 2023-09-11 MED ORDER — IBUPROFEN 600 MG PO TABS
600.0000 mg | ORAL_TABLET | Freq: Once | ORAL | Status: AC
  Administered 2023-09-11: 600 mg via ORAL
  Filled 2023-09-11: qty 1

## 2023-09-11 MED ORDER — OXYCODONE-ACETAMINOPHEN 5-325 MG PO TABS
2.0000 | ORAL_TABLET | Freq: Once | ORAL | Status: AC
  Administered 2023-09-11: 2 via ORAL
  Filled 2023-09-11: qty 2

## 2023-09-11 MED ORDER — OXYCODONE-ACETAMINOPHEN 5-325 MG PO TABS: 2.0000 | ORAL_TABLET | Freq: Four times a day (QID) | ORAL | 0 refills | Status: AC | PRN

## 2023-09-11 MED ORDER — LIDOCAINE VISCOUS HCL 2 % MT SOLN
15.0000 mL | Freq: Once | OROMUCOSAL | Status: AC
  Administered 2023-09-11: 15 mL via OROMUCOSAL
  Filled 2023-09-11: qty 15

## 2023-09-11 MED ORDER — AMOXICILLIN-POT CLAVULANATE 875-125 MG PO TABS
1.0000 | ORAL_TABLET | Freq: Once | ORAL | Status: AC
  Administered 2023-09-11: 1 via ORAL
  Filled 2023-09-11: qty 1

## 2023-09-11 NOTE — ED Notes (Signed)
 Ice pack applied to Right posterior hand

## 2023-09-11 NOTE — Discharge Instructions (Addendum)
 As we discussed, you will need to follow-up with a doctor in about a week to have the sutures removed from your lip.  We recommend that you call the office of Dr. Ulice Bold at the number provided so that you can follow-up with her or another plastic surgeon in case additional revision and management is needed to give you the best possible wound care.  You should also call the office of Dr. Joice Lofts and schedule follow-up appointment for about a week from now (either the end of this coming week or the beginning of the following week) to have your hand reevaluated to determine the best way to manage it.  You may require surgical intervention, so it is important to follow-up.  Return to the emergency department if you develop new or worsening symptoms that concern you.

## 2023-09-11 NOTE — ED Notes (Signed)
EDP Forbach in room at this time. 
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
## 2023-09-19 ENCOUNTER — Other Ambulatory Visit: Payer: Self-pay | Admitting: Orthopedic Surgery

## 2023-09-19 ENCOUNTER — Emergency Department
Admission: EM | Admit: 2023-09-19 | Discharge: 2023-09-19 | Disposition: A | Discharge status: Home / Self Care | Attending: Emergency Medicine | Admitting: Emergency Medicine

## 2023-09-19 ENCOUNTER — Other Ambulatory Visit: Payer: Self-pay

## 2023-09-19 DIAGNOSIS — Z4802 Encounter for removal of sutures: Secondary | ICD-10-CM | POA: Insufficient documentation
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
## 2023-09-19 NOTE — ED Notes (Signed)
 PA, Jenise at bedside to remove sutures.

## 2023-09-19 NOTE — ED Triage Notes (Signed)
 Pt here to have his stitches in his lip removed. Pt denies any issues.

## 2023-09-19 NOTE — Discharge Instructions (Addendum)
Keep the wound clean and dry.  °

## 2023-09-20 ENCOUNTER — Encounter: Payer: Self-pay | Admitting: Orthopedic Surgery

## 2023-09-23 ENCOUNTER — Ambulatory Visit
Admission: RE | Admit: 2023-09-23 | Discharge: 2023-09-23 | Disposition: A | Discharge status: Home / Self Care | Attending: Orthopedic Surgery | Admitting: Orthopedic Surgery

## 2023-09-23 ENCOUNTER — Ambulatory Visit: Payer: Self-pay | Admitting: Anesthesiology

## 2023-09-23 ENCOUNTER — Other Ambulatory Visit: Payer: Self-pay

## 2023-09-23 ENCOUNTER — Encounter: Payer: Self-pay | Admitting: Orthopedic Surgery

## 2023-09-23 ENCOUNTER — Encounter
Admission: RE | Disposition: A | Discharge status: Home / Self Care | Payer: Self-pay | Source: Home / Self Care | Attending: Orthopedic Surgery

## 2023-09-23 ENCOUNTER — Ambulatory Visit: Payer: Self-pay

## 2023-09-23 DIAGNOSIS — S62316A Displaced fracture of base of fifth metacarpal bone, right hand, initial encounter for closed fracture: Secondary | ICD-10-CM | POA: Insufficient documentation

## 2023-09-23 DIAGNOSIS — S01511A Laceration without foreign body of lip, initial encounter: Secondary | ICD-10-CM | POA: Diagnosis not present

## 2023-09-23 DIAGNOSIS — Z792 Long term (current) use of antibiotics: Secondary | ICD-10-CM | POA: Diagnosis not present

## 2023-09-23 HISTORY — PX: FINGER CLOSED REDUCTION: SHX1633

## 2023-09-23 HISTORY — DX: Other specified health status: Z78.9

## 2023-09-23 SURGERY — CLOSED REDUCTION, FRACTURE, METACARPAL BONE
Anesthesia: General | Site: Finger | Laterality: Right

## 2023-09-23 MED ORDER — CEFAZOLIN SODIUM-DEXTROSE 2-4 GM/100ML-% IV SOLN
2.0000 g | INTRAVENOUS | Status: AC
  Administered 2023-09-23: 2 g via INTRAVENOUS

## 2023-09-23 MED ORDER — PROPOFOL 1000 MG/100ML IV EMUL
INTRAVENOUS | Status: AC
  Filled 2023-09-23: qty 100

## 2023-09-23 MED ORDER — MIDAZOLAM HCL 2 MG/2ML IJ SOLN
INTRAMUSCULAR | Status: DC | PRN
  Administered 2023-09-23: 2 mg via INTRAVENOUS

## 2023-09-23 MED ORDER — MIDAZOLAM HCL 2 MG/2ML IJ SOLN
INTRAMUSCULAR | Status: AC
  Filled 2023-09-23: qty 2

## 2023-09-23 MED ORDER — CEFAZOLIN SODIUM-DEXTROSE 2-3 GM-%(50ML) IV SOLR
INTRAVENOUS | Status: AC
  Filled 2023-09-23: qty 50

## 2023-09-23 MED ORDER — LIDOCAINE HCL (PF) 2 % IJ SOLN
INTRAMUSCULAR | Status: AC
  Filled 2023-09-23: qty 5

## 2023-09-23 MED ORDER — DEXAMETHASONE SODIUM PHOSPHATE 4 MG/ML IJ SOLN
INTRAMUSCULAR | Status: DC | PRN
  Administered 2023-09-23: 4 mg via INTRAVENOUS

## 2023-09-23 MED ORDER — PROPOFOL 500 MG/50ML IV EMUL
INTRAVENOUS | Status: DC | PRN
  Administered 2023-09-23: 175 ug/kg/min via INTRAVENOUS

## 2023-09-23 MED ORDER — ONDANSETRON HCL 4 MG/2ML IJ SOLN
INTRAMUSCULAR | Status: DC | PRN
  Administered 2023-09-23: 4 mg via INTRAVENOUS

## 2023-09-23 MED ORDER — LIDOCAINE HCL (CARDIAC) PF 100 MG/5ML IV SOSY
PREFILLED_SYRINGE | INTRAVENOUS | Status: DC | PRN
  Administered 2023-09-23: 20 mg via INTRAVENOUS

## 2023-09-23 MED ORDER — PROPOFOL 10 MG/ML IV BOLUS
INTRAVENOUS | Status: DC | PRN
  Administered 2023-09-23: 50 mg via INTRAVENOUS
  Administered 2023-09-23: 100 mg via INTRAVENOUS
  Administered 2023-09-23: 50 mg via INTRAVENOUS

## 2023-09-23 MED ORDER — HYDROCODONE-ACETAMINOPHEN 5-325 MG PO TABS: 1.0000 | ORAL_TABLET | Freq: Four times a day (QID) | ORAL | 0 refills | Status: AC | PRN

## 2023-09-23 MED ORDER — LACTATED RINGERS IV SOLN: INTRAVENOUS | Status: DC

## 2023-09-23 MED ORDER — ONDANSETRON HCL 4 MG/2ML IJ SOLN
INTRAMUSCULAR | Status: AC
  Filled 2023-09-23: qty 2

## 2023-09-23 MED ORDER — FENTANYL CITRATE (PF) 100 MCG/2ML IJ SOLN
INTRAMUSCULAR | Status: AC
  Filled 2023-09-23: qty 2

## 2023-09-23 MED ORDER — LIDOCAINE HCL 1 % IJ SOLN
INTRAMUSCULAR | Status: DC | PRN
  Administered 2023-09-23: 10 mL

## 2023-09-23 MED ORDER — SODIUM CHLORIDE 0.9 % IV SOLN: INTRAVENOUS | Status: DC

## 2023-09-23 MED ORDER — FENTANYL CITRATE (PF) 100 MCG/2ML IJ SOLN
INTRAMUSCULAR | Status: DC | PRN
  Administered 2023-09-23: 25 ug via INTRAVENOUS

## 2023-09-23 SURGICAL SUPPLY — 14 items
BNDG ELASTIC 3X5.8 VLCR NS LF (GAUZE/BANDAGES/DRESSINGS) ×1 IMPLANT
CHLORAPREP W/TINT 26 (MISCELLANEOUS) ×1 IMPLANT
DRAPE FLUOR MINI C-ARM 54X84 (DRAPES) ×1 IMPLANT
GAUZE SPONGE 4X4 12PLY STRL (GAUZE/BANDAGES/DRESSINGS) ×1 IMPLANT
GAUZE XEROFORM 1X8 LF (GAUZE/BANDAGES/DRESSINGS) ×1 IMPLANT
GLOVE SURG SYN 9.0 PF PI (GLOVE) ×1 IMPLANT
GOWN STRL REIN 2XL XLG LVL4 (GOWN DISPOSABLE) ×1 IMPLANT
GOWN STRL REUS W/ TWL LRG LVL3 (GOWN DISPOSABLE) ×1 IMPLANT
K-WIRE DBL END TROCAR 6X.062 (WIRE) ×2 IMPLANT
K-Wire 1.6mm (0.062") x 152mm (6") (Wire) IMPLANT
KIT TURNOVER KIT A (KITS) ×1 IMPLANT
KWIRE DBL END TROCAR 6X.062 (WIRE) IMPLANT
PACK EXTREMITY ARMC (MISCELLANEOUS) ×1 IMPLANT
SPLINT CAST 1 STEP 3X12 (MISCELLANEOUS) ×1 IMPLANT
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
## 2023-09-23 NOTE — Anesthesia Preprocedure Evaluation (Addendum)
 Anesthesia Evaluation  Patient identified by MRN, date of birth, ID band Patient awake    Reviewed: Allergy & Precautions, H&P , NPO status , Patient's Chart, lab work & pertinent test results  Airway Mallampati: III  TM Distance: >3 FB Neck ROM: Full    Dental no notable dental hx.    Pulmonary Current Smoker and Patient abstained from smoking. Has had cough, productive clear sputum yesterday only, afebrile, no sore throat.    Breath sounds slightly coarse bilaterally.  Not short of breath. Eupnea  Discussed w/Dr. Rosita Kea. Will plan propofol drip, and avoid airway manipulation. Fracture needs fixed and we can avoid airway manipulation.  Pulmonary exam normal breath sounds clear to auscultation       Cardiovascular negative cardio ROS Normal cardiovascular exam Rhythm:Regular Rate:Normal     Neuro/Psych negative neurological ROS  negative psych ROS   GI/Hepatic negative GI ROS, Neg liver ROS,,,  Endo/Other  negative endocrine ROS    Renal/GU negative Renal ROS  negative genitourinary   Musculoskeletal negative musculoskeletal ROS (+)    Abdominal   Peds negative pediatric ROS (+)  Hematology negative hematology ROS (+)   Anesthesia Other Findings   Reproductive/Obstetrics negative OB ROS                              Anesthesia Physical Anesthesia Plan  ASA: 1  Anesthesia Plan: General   Post-op Pain Management:    Induction: Intravenous  PONV Risk Score and Plan:   Airway Management Planned: Natural Airway and Nasal Cannula  Additional Equipment:   Intra-op Plan:   Post-operative Plan:   Informed Consent: I have reviewed the patients History and Physical, chart, labs and discussed the procedure including the risks, benefits and alternatives for the proposed anesthesia with the patient or authorized representative who has indicated his/her understanding and acceptance.      Dental Advisory Given  Plan Discussed with: Anesthesiologist, CRNA and Surgeon  Anesthesia Plan Comments: (Patient consented for risks of anesthesia including but not limited to:  - adverse reactions to medications - risk of airway placement if required - damage to eyes, teeth, lips or other oral mucosa - nerve damage due to positioning  - sore throat or hoarseness - Damage to heart, brain, nerves, lungs, other parts of body or loss of life  Patient voiced understanding and assent.)         Anesthesia Quick Evaluation

## 2023-09-23 NOTE — Transfer of Care (Signed)
 Immediate Anesthesia Transfer of Care Note  Patient: Gavin Diaz  Procedure(s) Performed: CLOSED REDUCTION, FRACTURE, METACARPAL BONE (Right: Finger)  Patient Location: PACU  Anesthesia Type: General  Level of Consciousness: awake, alert  and patient cooperative  Airway and Oxygen Therapy: Patient Spontanous Breathing and Patient connected to supplemental oxygen  Post-op Assessment: Post-op Vital signs reviewed, Patient's Cardiovascular Status Stable, Respiratory Function Stable, Patent Airway and No signs of Nausea or vomiting  Post-op Vital Signs: Reviewed and stable  Complications: No notable events documented.

## 2023-09-23 NOTE — Discharge Instructions (Signed)
 Keep arm elevated Ice to the front of the hand today and tomorrow to help with swelling and pain Work on finger motion Call office if you are having problems  [phone] Pain medicine as directed
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
## 2023-09-26 ENCOUNTER — Encounter: Payer: Self-pay | Admitting: Orthopedic Surgery

## 2023-09-26 NOTE — Anesthesia Postprocedure Evaluation (Signed)
 Anesthesia Post Note  Patient: Gavin Diaz  Procedure(s) Performed: CLOSED REDUCTION, FRACTURE, METACARPAL BONE (Right: Finger)  Patient location during evaluation: PACU Anesthesia Type: General Level of consciousness: awake and alert Pain management: pain level controlled Vital Signs Assessment: post-procedure vital signs reviewed and stable Respiratory status: spontaneous breathing, nonlabored ventilation, respiratory function stable and patient connected to nasal cannula oxygen Cardiovascular status: blood pressure returned to baseline and stable Postop Assessment: no apparent nausea or vomiting Anesthetic complications: no   No notable events documented.   Last Vitals:  Vitals:   09/23/23 1505 09/23/23 1515  BP:  (!) 153/97  Pulse: 60 (!) 57  Resp: 12 14  Temp:  (!) 36.4 C  SpO2: 99% 99%    Last Pain:  Vitals:   09/23/23 1515  TempSrc:   PainSc: 0-No pain                 Makinzie Considine C Pharrell Ledford

## 2023-11-11 ENCOUNTER — Institutional Professional Consult (permissible substitution): Admitting: Plastic Surgery
